# Patient Record
Sex: Female | Born: 1970 | Race: Black or African American | Hispanic: No | Marital: Single | Smoking: Current every day smoker
Health system: Southern US, Community
[De-identification: ages and names within clinical notes are randomized; demographics above are authoritative.]

## PROBLEM LIST (undated history)

## (undated) DIAGNOSIS — G894 Chronic pain syndrome: Secondary | ICD-10-CM

## (undated) DIAGNOSIS — K589 Irritable bowel syndrome without diarrhea: Secondary | ICD-10-CM

## (undated) DIAGNOSIS — M797 Fibromyalgia: Secondary | ICD-10-CM

## (undated) DIAGNOSIS — E119 Type 2 diabetes mellitus without complications: Secondary | ICD-10-CM

## (undated) HISTORY — PX: ABDOMINAL HYSTERECTOMY: SHX81

## (undated) HISTORY — PX: HERNIA REPAIR: SHX51

## (undated) HISTORY — PX: TUBAL LIGATION: SHX77

---

## 2012-09-28 ENCOUNTER — Encounter (HOSPITAL_COMMUNITY): Payer: Self-pay | Admitting: Emergency Medicine

## 2012-09-28 ENCOUNTER — Emergency Department (HOSPITAL_COMMUNITY)
Admission: EM | Admit: 2012-09-28 | Discharge: 2012-09-29 | Disposition: A | Discharge status: Home / Self Care | Payer: Self-pay | Attending: Emergency Medicine | Admitting: Emergency Medicine

## 2012-09-28 DIAGNOSIS — H539 Unspecified visual disturbance: Secondary | ICD-10-CM | POA: Insufficient documentation

## 2012-09-28 DIAGNOSIS — R209 Unspecified disturbances of skin sensation: Secondary | ICD-10-CM | POA: Insufficient documentation

## 2012-09-28 DIAGNOSIS — IMO0001 Reserved for inherently not codable concepts without codable children: Secondary | ICD-10-CM | POA: Insufficient documentation

## 2012-09-28 DIAGNOSIS — G894 Chronic pain syndrome: Secondary | ICD-10-CM | POA: Insufficient documentation

## 2012-09-28 DIAGNOSIS — E119 Type 2 diabetes mellitus without complications: Secondary | ICD-10-CM | POA: Insufficient documentation

## 2012-09-28 DIAGNOSIS — Z794 Long term (current) use of insulin: Secondary | ICD-10-CM | POA: Insufficient documentation

## 2012-09-28 DIAGNOSIS — R51 Headache: Secondary | ICD-10-CM | POA: Insufficient documentation

## 2012-09-28 DIAGNOSIS — M797 Fibromyalgia: Secondary | ICD-10-CM | POA: Insufficient documentation

## 2012-09-28 DIAGNOSIS — Z79899 Other long term (current) drug therapy: Secondary | ICD-10-CM | POA: Insufficient documentation

## 2012-09-28 DIAGNOSIS — F17200 Nicotine dependence, unspecified, uncomplicated: Secondary | ICD-10-CM | POA: Insufficient documentation

## 2012-09-28 HISTORY — DX: Chronic pain syndrome: G89.4

## 2012-09-28 HISTORY — DX: Fibromyalgia: M79.7

## 2012-09-28 HISTORY — DX: Type 2 diabetes mellitus without complications: E11.9

## 2012-09-28 LAB — COMPREHENSIVE METABOLIC PANEL
CO2: 25 mEq/L (ref 19–32)
Chloride: 99 mEq/L (ref 96–112)
Creatinine, Ser: 0.47 mg/dL — ABNORMAL LOW (ref 0.50–1.10)
GFR calc Af Amer: >90 mL/min (ref >90)
Potassium: 3.5 mEq/L (ref 3.5–5.1)
Sodium: 134 mEq/L — ABNORMAL LOW (ref 135–145)
Total Protein: 7.1 g/dL (ref 6.0–8.3)

## 2012-09-28 LAB — CBC WITH DIFFERENTIAL/PLATELET
Basophils Absolute: 0 10*3/uL (ref 0.0–0.1)
Basophils Relative: 0 % (ref 0–1)
HCT: 41.3 % (ref 36.0–46.0)
Lymphocytes Relative: 33 % (ref 12–46)
MCHC: 33.4 g/dL (ref 30.0–36.0)
Neutro Abs: 4.8 10*3/uL (ref 1.7–7.7)
Neutrophils Relative %: 60 % (ref 43–77)
RDW: 12.7 % (ref 11.5–15.5)
WBC: 7.9 10*3/uL (ref 4.0–10.5)

## 2012-09-28 LAB — GLUCOSE, CAPILLARY: Glucose-Capillary: 307 mg/dL — ABNORMAL HIGH (ref 70–99)

## 2012-09-28 MED ORDER — DIPHENHYDRAMINE HCL 50 MG/ML IJ SOLN
25.0000 mg | Freq: Once | INTRAMUSCULAR | Status: AC
  Administered 2012-09-28: 25 mg via INTRAVENOUS
  Filled 2012-09-28: qty 1

## 2012-09-28 MED ORDER — SODIUM CHLORIDE 0.9 % IV BOLUS (SEPSIS)
1000.0000 mL | Freq: Once | INTRAVENOUS | Status: AC
  Administered 2012-09-28: 1000 mL via INTRAVENOUS

## 2012-09-28 MED ORDER — METOCLOPRAMIDE HCL 5 MG/ML IJ SOLN
10.0000 mg | Freq: Once | INTRAMUSCULAR | Status: AC
  Administered 2012-09-28: 10 mg via INTRAVENOUS
  Filled 2012-09-28: qty 2

## 2012-09-28 MED ORDER — ONDANSETRON HCL 4 MG PO TABS: 4.0000 mg | ORAL_TABLET | Freq: Four times a day (QID) | ORAL | Status: DC

## 2012-09-28 MED ORDER — TRAMADOL HCL 50 MG PO TABS: 50.0000 mg | ORAL_TABLET | Freq: Four times a day (QID) | ORAL | Status: DC | PRN

## 2012-09-28 NOTE — ED Notes (Signed)
Patient complaining of persistent headache for the past two to three days.  Patient also complaining of swollen lymph nodes around neck.  Patient states that she feels "spacy"; denies light sensitivity, dizziness, nausea, and vomiting.  Patient is diabetic.

## 2012-09-28 NOTE — ED Provider Notes (Signed)
History     CSN: [id number]  Arrival date & time 09/28/12  2040   First MD Initiated Contact with Patient 09/28/12 2221      Chief Complaint  Patient presents with  . Headache    (Consider location/radiation/quality/duration/timing/severity/associated sxs/prior treatment) HPI Comments: 42 y /o F h/o DM, fibromyalgia, chronic pain p/w headache. Gradual onset x2-3 days. Feels lumps on back of head, left side. No fevers. No neck pain. Feels a little "strange". Unable to describe.  Patient is a 42 y.o. female presenting with headaches. The history is provided by the patient.  Headache  This is a new problem. The current episode started more than 2 days ago. The problem occurs constantly. The problem has been gradually worsening. The headache is associated with nothing. Pain location: global. The pain is moderate. The pain does not radiate. Pertinent negatives include no fever, no malaise/fatigue, no near-syncope, no shortness of breath, no nausea and no vomiting. She has tried NSAIDs for the symptoms. The treatment provided mild relief.    Past Medical History  Diagnosis Date  . Diabetes mellitus without complication   . Fibromyalgia   . Chronic pain syndrome     Past Surgical History  Procedure Date  . Abdominal hysterectomy   . Hernia repair   . Tubal ligation     History reviewed. No pertinent family history.  History  Substance Use Topics  . Smoking status: Current Every Day Smoker -- 0.5 packs/day  . Smokeless tobacco: Not on file  . Alcohol Use: Yes    OB History    Grav Para Term Preterm Abortions TAB SAB Ect Mult Living                  Review of Systems  Constitutional: Negative for fever, chills and malaise/fatigue.  HENT: Negative for congestion and rhinorrhea.   Eyes: Positive for visual disturbance (blurry vision once earlier, resolved with rubbing eyes). Negative for pain.  Respiratory: Negative for cough and shortness of breath.   Cardiovascular:  Negative for chest pain, leg swelling and near-syncope.  Gastrointestinal: Negative for nausea, vomiting, abdominal pain and diarrhea.  Genitourinary: Negative for dysuria, hematuria, flank pain and difficulty urinating.  Musculoskeletal: Negative for back pain.  Skin: Negative for color change and rash.  Neurological: Positive for numbness (chronic, BUE and BLE. from DM) and headaches. Negative for dizziness and weakness.  All other systems reviewed and are negative.    Allergies  Shellfish allergy and Latex  Home Medications   Current Outpatient Rx  Name  Route  Sig  Dispense  Refill  . IBUPROFEN 200 MG PO TABS   Oral   Take 200-800 mg by mouth daily as needed. For pain         . INSULIN ASPART 100 UNIT/ML [place] SOLN   Subcutaneous   Inject 0-16 Units into the skin 3 (three) times daily before meals. Sliding scale:  Blood sugars over 120 mg - for each additional 25 mg add 1 unit of insulin         . ADULT MULTIVITAMIN W/MINERALS CH   Oral   Take 1 tablet by mouth daily.         Marland Kitchen ONDANSETRON HCL 4 MG PO TABS   Oral   Take 1 tablet (4 mg total) by mouth every 6 (six) hours.   12 tablet   0   . TRAMADOL HCL 50 MG PO TABS   Oral   Take 1 tablet (50 mg total) by  mouth every 6 (six) hours as needed for pain.   5 tablet   0     BP 137/90  Pulse 87  Temp 98.6 F (37 C) (Oral)  Resp 18  SpO2 96%  Physical Exam  Nursing note and vitals reviewed. Constitutional: She is oriented to person, place, and time. She appears well-developed and well-nourished. No distress.  HENT:  Head: Normocephalic and atraumatic.       Very small palpable NTTP lymph node to left posterior scalp  Eyes: Conjunctivae normal are normal. Right eye exhibits no discharge. Left eye exhibits no discharge.  Neck: No tracheal deviation present.  Cardiovascular: Normal rate, regular rhythm, normal heart sounds and intact distal pulses.   Pulmonary/Chest: Effort normal and breath sounds normal. No  stridor. No respiratory distress. She has no wheezes. She has no rales.  Abdominal: Soft. She exhibits no distension. There is no tenderness. There is no guarding.  Musculoskeletal: She exhibits no edema and no tenderness.  Neurological: She is alert and oriented to person, place, and time. She has normal strength. No cranial nerve deficit or sensory deficit. Coordination normal. GCS eye subscore is 4. GCS verbal subscore is 5. GCS motor subscore is 6.  Skin: Skin is warm and dry.  Psychiatric: She has a normal mood and affect. Her behavior is normal.    ED Course  Procedures (including critical care time)  Labs Reviewed  COMPREHENSIVE METABOLIC PANEL - Abnormal; Notable for the following:    Sodium 134 (*)     Glucose, Bld 370 (*)     Creatinine, Ser 0.47 (*)     Albumin 3.1 (*)     Total Bilirubin 0.1 (*)     All other components within normal limits  GLUCOSE, CAPILLARY - Abnormal; Notable for the following:    Glucose-Capillary 307 (*)     All other components within normal limits  CBC WITH DIFFERENTIAL   No results found.   1. Headache       MDM    42 y/o F p/w HA. HDS, af. NAD. Neuro intact. Gradual onset. No thunderclap. No injuries. No LOC or syncope. Symptoms improved after HA cocktail.  Doubt idiopathic intracranial hypertension, cerebral venous thrombosis, temporal arteritis, skull fracture, epidural hematoma, subdural hematoma, subarachnoid hemorrhage, or other intracranial hemorrhage, concussion, trigeminal neuralgia, cluster headache, eye pathology or other emergent pathology as this is an atypical history and physical, low risk, and primary diagnosis is much more likely,.     Dc home. Return precautions given. Follow up with primary care physician. Patient in agreement with plan   Labs and imaging reviewed by myself and considered in medical decision making if ordered. Imaging interpreted by radiology.   Discussed case with Dr. Ranae Palms who is in agreement  with assessment and plan.  New Prescriptions   ONDANSETRON (ZOFRAN) 4 MG TABLET    Take 1 tablet (4 mg total) by mouth every 6 (six) hours.   TRAMADOL (ULTRAM) 50 MG TABLET    Take 1 tablet (50 mg total) by mouth every 6 (six) hours as needed for pain.          Stevie Kern, MD 09/29/12 [phone]

## 2012-09-28 NOTE — ED Notes (Signed)
Patient states that her sugar feels low; requesting CBG to be checked.

## 2012-09-29 NOTE — ED Provider Notes (Signed)
I saw and evaluated the patient, reviewed the resident's note and I agree with the findings and plan.   Loren Racer, MD 09/29/12 [phone]

## 2014-02-27 ENCOUNTER — Emergency Department (HOSPITAL_COMMUNITY): Payer: Self-pay

## 2014-02-27 ENCOUNTER — Emergency Department (HOSPITAL_COMMUNITY)
Admission: EM | Admit: 2014-02-27 | Discharge: 2014-02-27 | Disposition: A | Discharge status: Home / Self Care | Payer: Self-pay | Attending: Emergency Medicine | Admitting: Emergency Medicine

## 2014-02-27 ENCOUNTER — Encounter (HOSPITAL_COMMUNITY): Payer: Self-pay | Admitting: Emergency Medicine

## 2014-02-27 DIAGNOSIS — F17200 Nicotine dependence, unspecified, uncomplicated: Secondary | ICD-10-CM | POA: Insufficient documentation

## 2014-02-27 DIAGNOSIS — S0993XA Unspecified injury of face, initial encounter: Secondary | ICD-10-CM | POA: Insufficient documentation

## 2014-02-27 DIAGNOSIS — Z8719 Personal history of other diseases of the digestive system: Secondary | ICD-10-CM | POA: Insufficient documentation

## 2014-02-27 DIAGNOSIS — E119 Type 2 diabetes mellitus without complications: Secondary | ICD-10-CM | POA: Insufficient documentation

## 2014-02-27 DIAGNOSIS — Z91040 Latex allergy status: Secondary | ICD-10-CM | POA: Insufficient documentation

## 2014-02-27 DIAGNOSIS — IMO0001 Reserved for inherently not codable concepts without codable children: Secondary | ICD-10-CM | POA: Insufficient documentation

## 2014-02-27 DIAGNOSIS — Y9389 Activity, other specified: Secondary | ICD-10-CM | POA: Insufficient documentation

## 2014-02-27 DIAGNOSIS — Z79899 Other long term (current) drug therapy: Secondary | ICD-10-CM | POA: Insufficient documentation

## 2014-02-27 DIAGNOSIS — S199XXA Unspecified injury of neck, initial encounter: Secondary | ICD-10-CM

## 2014-02-27 DIAGNOSIS — S298XXA Other specified injuries of thorax, initial encounter: Secondary | ICD-10-CM | POA: Insufficient documentation

## 2014-02-27 DIAGNOSIS — Y929 Unspecified place or not applicable: Secondary | ICD-10-CM | POA: Insufficient documentation

## 2014-02-27 DIAGNOSIS — G8929 Other chronic pain: Secondary | ICD-10-CM | POA: Insufficient documentation

## 2014-02-27 DIAGNOSIS — Z3202 Encounter for pregnancy test, result negative: Secondary | ICD-10-CM | POA: Insufficient documentation

## 2014-02-27 DIAGNOSIS — Y32XXXA Crashing of motor vehicle, undetermined intent, initial encounter: Secondary | ICD-10-CM | POA: Insufficient documentation

## 2014-02-27 DIAGNOSIS — Z794 Long term (current) use of insulin: Secondary | ICD-10-CM | POA: Insufficient documentation

## 2014-02-27 DIAGNOSIS — S3981XA Other specified injuries of abdomen, initial encounter: Secondary | ICD-10-CM | POA: Insufficient documentation

## 2014-02-27 HISTORY — DX: Irritable bowel syndrome, unspecified: K58.9

## 2014-02-27 LAB — I-STAT CHEM 8, ED
BUN: 10 mg/dL (ref 6–23)
CALCIUM ION: 1.22 mmol/L (ref 1.12–1.23)
CHLORIDE: 106 meq/L (ref 96–112)
CREATININE: 0.5 mg/dL (ref 0.50–1.10)
Glucose, Bld: 145 mg/dL — ABNORMAL HIGH (ref 70–99)
HCT: 44 % (ref 36.0–46.0)
Hemoglobin: 15 g/dL (ref 12.0–15.0)
Potassium: 3.5 mEq/L — ABNORMAL LOW (ref 3.7–5.3)
Sodium: 145 mEq/L (ref 137–147)
TCO2: 23 mmol/L (ref 0–100)

## 2014-02-27 LAB — CBG MONITORING, ED: GLUCOSE-CAPILLARY: 260 mg/dL — AB (ref 70–99)

## 2014-02-27 LAB — POC URINE PREG, ED: PREG TEST UR: NEGATIVE

## 2014-02-27 MED ORDER — OXYCODONE-ACETAMINOPHEN 5-325 MG PO TABS
2.0000 | ORAL_TABLET | Freq: Once | ORAL | Status: AC
  Administered 2014-02-27: 2 via ORAL
  Filled 2014-02-27: qty 2

## 2014-02-27 MED ORDER — IOHEXOL 300 MG/ML  SOLN
100.0000 mL | Freq: Once | INTRAMUSCULAR | Status: AC | PRN
  Administered 2014-02-27: 100 mL via INTRAVENOUS

## 2014-02-27 MED ORDER — OXYCODONE-ACETAMINOPHEN 5-325 MG PO TABS
1.0000 | ORAL_TABLET | Freq: Once | ORAL | Status: AC
Start: 2014-02-27 — End: ?

## 2014-02-27 MED ORDER — CYCLOBENZAPRINE HCL 5 MG PO TABS: 5.0000 mg | ORAL_TABLET | Freq: Two times a day (BID) | ORAL | Status: AC | PRN

## 2014-02-27 MED ORDER — MORPHINE SULFATE 4 MG/ML IJ SOLN
6.0000 mg | Freq: Once | INTRAMUSCULAR | Status: DC
Start: 2014-02-27 — End: 2014-02-27

## 2014-02-27 MED ORDER — ONDANSETRON HCL 4 MG/2ML IJ SOLN: 4.0000 mg | Freq: Once | INTRAMUSCULAR | Status: DC

## 2014-02-27 MED ORDER — CYCLOBENZAPRINE HCL 10 MG PO TABS: 5.0000 mg | ORAL_TABLET | Freq: Two times a day (BID) | ORAL | Status: DC | PRN

## 2014-02-27 NOTE — ED Notes (Signed)
EKG signed by Dr. Bebe ShaggyWickline at 16:08.

## 2014-02-27 NOTE — ED Notes (Signed)
Patient transported to CT 

## 2014-02-27 NOTE — ED Notes (Signed)
Dr. Rubin PayorPickering at the bedside. Preparing for discharge.
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
## 2014-02-27 NOTE — ED Notes (Signed)
CBG 260

## 2014-02-27 NOTE — ED Notes (Signed)
Returned from radiology and reattached to monitor.
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
## 2018-09-27 ENCOUNTER — Ambulatory Visit (INDEPENDENT_AMBULATORY_CARE_PROVIDER_SITE_OTHER): Payer: Self-pay | Admitting: Orthopaedic Surgery

## 2024-06-14 ENCOUNTER — Ambulatory Visit (INDEPENDENT_AMBULATORY_CARE_PROVIDER_SITE_OTHER): Admitting: Neurosurgery

## 2024-06-14 ENCOUNTER — Encounter: Payer: Self-pay | Admitting: Neurosurgery

## 2024-06-14 VITALS — BP 138/78 | HR 78 | Ht 66.0 in | Wt 205.0 lb

## 2024-06-14 DIAGNOSIS — Z982 Presence of cerebrospinal fluid drainage device: Secondary | ICD-10-CM | POA: Diagnosis not present

## 2024-06-14 DIAGNOSIS — G932 Benign intracranial hypertension: Secondary | ICD-10-CM

## 2024-06-14 NOTE — Progress Notes (Signed)
 53 year old lady with a history of idiopathic intracranial hypertension and placement of a right-sided ventriculoperitoneal shunt.  This was many years ago and she did very well.  Unfortunately she developed elevated intracranial pressures again and this was confirmed with a lumbar puncture.  She subsequently had a right sided transverse/sigmoid stent placed and was discharged home the next day.  About a week thereafter, she started having intermittent vertigo: She has the spells in which the world spins around her and she is very nauseous with that.  She does not complain of any ringing in her ears.  She has fallen a couple of times and also feels that her equilibrium is completely off.  She does not endorse any loss of consciousness.  She was evaluated by cardiology and they do not feel that there is anything wrong with her.  I told her that her valve is set at 120 and now she also has a standing very well may be that she is over draining.  I will get x-rays and I specifically requested a flatplate to evaluate the valve setting and I will also get a CT scan of the head.  I will request the old images as well.  I will also request ENT for an evaluation and I have sent the information to the [facility] team as she wants that care provider locally.

## 2024-06-21 ENCOUNTER — Telehealth: Payer: Self-pay

## 2024-06-21 ENCOUNTER — Ambulatory Visit
Admission: RE | Admit: 2024-06-21 | Discharge: 2024-06-21 | Disposition: A | Source: Ambulatory Visit | Attending: Neurosurgery | Admitting: Neurosurgery

## 2024-06-21 DIAGNOSIS — G932 Benign intracranial hypertension: Secondary | ICD-10-CM

## 2024-06-21 MED ORDER — PREDNISONE 50 MG PO TABS: ORAL_TABLET | ORAL | 0 refills | Status: DC

## 2024-06-25 ENCOUNTER — Ambulatory Visit
Admission: RE | Admit: 2024-06-25 | Discharge: 2024-06-25 | Disposition: A | Discharge status: Home / Self Care | Source: Ambulatory Visit | Attending: Neurosurgery | Admitting: Neurosurgery

## 2024-06-25 DIAGNOSIS — G932 Benign intracranial hypertension: Secondary | ICD-10-CM

## 2024-06-25 MED ORDER — IOPAMIDOL (ISOVUE-370) INJECTION 76%
75.0000 mL | Freq: Once | INTRAVENOUS | Status: AC | PRN
  Administered 2024-06-25: 75 mL via INTRAVENOUS

## 2024-06-27 ENCOUNTER — Inpatient Hospital Stay
Admission: RE | Admit: 2024-06-27 | Discharge: 2024-06-27 | Disposition: A | Discharge status: Home / Self Care | Payer: Self-pay | Source: Ambulatory Visit | Attending: Neurosurgery | Admitting: Neurosurgery

## 2024-06-27 ENCOUNTER — Other Ambulatory Visit: Payer: Self-pay

## 2024-06-27 DIAGNOSIS — Z049 Encounter for examination and observation for unspecified reason: Secondary | ICD-10-CM

## 2024-06-28 ENCOUNTER — Ambulatory Visit
Admission: RE | Admit: 2024-06-28 | Discharge: 2024-06-28 | Disposition: A | Discharge status: Home / Self Care | Payer: Self-pay | Source: Ambulatory Visit | Attending: Neurosurgery | Admitting: Neurosurgery

## 2024-06-28 ENCOUNTER — Encounter: Payer: Self-pay | Admitting: Neurosurgery

## 2024-06-28 ENCOUNTER — Ambulatory Visit (INDEPENDENT_AMBULATORY_CARE_PROVIDER_SITE_OTHER): Admitting: Neurosurgery

## 2024-06-28 ENCOUNTER — Ambulatory Visit
Admission: RE | Admit: 2024-06-28 | Discharge: 2024-06-28 | Disposition: A | Discharge status: Home / Self Care | Source: Ambulatory Visit | Attending: Neurosurgery | Admitting: Neurosurgery

## 2024-06-28 VITALS — BP 143/89 | HR 86 | Temp 97.8°F | Ht 66.0 in | Wt 205.0 lb

## 2024-06-28 DIAGNOSIS — G932 Benign intracranial hypertension: Secondary | ICD-10-CM

## 2024-06-28 DIAGNOSIS — Z049 Encounter for examination and observation for unspecified reason: Secondary | ICD-10-CM

## 2024-06-28 DIAGNOSIS — Z982 Presence of cerebrospinal fluid drainage device: Secondary | ICD-10-CM | POA: Diagnosis not present

## 2024-06-28 NOTE — Addendum Note (Signed)
 Addended by: Linlee Cromie on: 06/28/2024 02:19 PM   Modules accepted: Orders

## 2024-06-28 NOTE — Progress Notes (Signed)
 53 year old lady with idiopathic intracranial hypertension and right-sided ventriculoperitoneal shunt.  She has recurrence of her symptoms.  Recently [a couple months ago], we placed a right sided transverse/sigmoid stent.  Intraoperatively there was a stenosis that could not be remedied due to active stricture of the transverse sinus which was not remedied by balloon inflation.  Postoperatively she did well from a pressure standpoint but she had severe delirium in the ICU.  She returned with recurrent symptoms and was referred back to me.  She was complaining of dizziness as well and I contacted Dr. Huel, otolaryngologist at [facility] who was going to see her.  She says that she is has still not seen them.  She says that at the retroauricular area still swells a lot and she has to take her glasses off.  On today's examination I do not see any significant swelling.  I told her that I would like for her to take pictures of this.  And I will still want her to call the [facility] team in [facility] to get an appointment.  I reviewed the x-rays and it shows that her valve is set somewhere between 70 and 80. I reviewed the CT venogram and this still shows that there is a stenosis in the mid segment of the stent.  I do not think that going in and doing a balloon angioplasty is safe as this can result in a tear of the transverse sinus.  I looked at the x-rays further and it seems that the peritoneal tubing is all the way to the right but it is not very deep in the peritoneal cavity.  To that end, I will get a CT of the chest and abdomen/pelvis to see if it is even in the abdominal cavity.  Lastly, I will order a lumbar puncture to check her pressures.  She has had multiple of these and the pressures have always ranged between 22 and 30.  It is theoretically possible that she is over draining with that valve setting.  I will see her back in a few weeks.

## 2024-07-01 NOTE — Addendum Note (Signed)
 Addended by: Giovanni Bath on: 07/01/2024 09:49 AM   Modules accepted: Orders

## 2024-07-10 ENCOUNTER — Ambulatory Visit
Admission: RE | Admit: 2024-07-10 | Discharge: 2024-07-10 | Disposition: A | Discharge status: Home / Self Care | Source: Ambulatory Visit | Attending: Neurosurgery | Admitting: Neurosurgery

## 2024-07-10 DIAGNOSIS — G932 Benign intracranial hypertension: Secondary | ICD-10-CM

## 2024-07-10 DIAGNOSIS — Z982 Presence of cerebrospinal fluid drainage device: Secondary | ICD-10-CM

## 2024-07-15 ENCOUNTER — Inpatient Hospital Stay: Admission: RE | Admit: 2024-07-15 | Source: Ambulatory Visit

## 2024-07-23 ENCOUNTER — Other Ambulatory Visit

## 2024-07-26 ENCOUNTER — Ambulatory Visit: Admitting: Neurosurgery

## 2024-08-08 NOTE — Discharge Instructions (Signed)

## 2024-08-09 ENCOUNTER — Inpatient Hospital Stay
Admission: RE | Admit: 2024-08-09 | Discharge: 2024-08-09 | Disposition: A | Source: Ambulatory Visit | Attending: Neurosurgery | Admitting: Neurosurgery

## 2024-08-09 DIAGNOSIS — G932 Benign intracranial hypertension: Secondary | ICD-10-CM

## 2024-08-12 ENCOUNTER — Telehealth: Payer: Self-pay

## 2024-08-12 DIAGNOSIS — G9600 Cerebrospinal fluid leak, unspecified: Secondary | ICD-10-CM

## 2024-08-12 NOTE — Telephone Encounter (Signed)
 Patient had a lumbar puncture on Friday 08/09/2024.   [facility] staff called that patient turned up at the facility with complaints of headache over the weekend despite being on bedrest all weekend.   Patient went to [facility] for evaluation and to see if a blood patch was needed. [facility] staff called to see if a blood patch could be ordered.   I told them that I would need to discuss with Dr. Janjua and cannot place order without verbal.   He is in surgery until approximately 1:45pm this afternoon.

## 2024-08-13 ENCOUNTER — Telehealth: Payer: Self-pay

## 2024-08-13 ENCOUNTER — Inpatient Hospital Stay
Admission: RE | Admit: 2024-08-13 | Discharge: 2024-08-13 | Disposition: A | Source: Ambulatory Visit | Attending: Neurosurgery | Admitting: Neurosurgery

## 2024-08-13 MED ORDER — PREDNISONE 50 MG PO TABS: ORAL_TABLET | ORAL | 0 refills | Status: AC

## 2024-08-13 NOTE — Progress Notes (Signed)
 Phone call to patient to review instructions for 13 hr prep for CT w/ contrast on 08/13/24 at 1030 AM. Prescription called into [facility]. Pt aware and verbalized understanding of instructions. Prescription: 12/09 2130- 50mg  Prednisone  12/10 0330- 50mg  Prednisone  0930 - 50mg  Prednisone  and 50mg  Benadryl 

## 2024-08-13 NOTE — Progress Notes (Signed)
 Spoke with pt earlier today to clarify anticoagulants. Pt stated she has been off of Plavix and Aspirin since Friday 12/05 because of the post LP headache and nausea.

## 2024-08-13 NOTE — Telephone Encounter (Signed)
 I spoke with [facility], they can so her blood patch today.   I called the patient and passed on that she should head there and be there before 1:30pm at the latest per Sarah.   She has not eaten anything this morning and will not eat anything.   She is aware that she can drink fluids.   She is coming to see us  on Friday for a follow up.

## 2024-08-13 NOTE — Telephone Encounter (Signed)
 Patient scheduled for Blood Patch today.

## 2024-08-13 NOTE — Telephone Encounter (Signed)
 Patient called that she had arrived at [facility] and they informed her that she could not have her Blood Patch today because she has not been premedicated for her contrast allergy.   I spoke with Dr.Janjua who recommended that I ask Dr. Lester for the most up to date protocol on premedication for contrast allergy.   Per Dr. Lester, Prednisone  50 mg at 13 hours, 7 hours, and 1 hour before then Benadryl  50 mg 1 hour before.   I discussed with Wilbern Dills, Scheduler for [facility]. Patient was rescheduled for tomorrow but then found out that she cannot have the blood patch for 5 days due to needing to be off of her plavix per Radiology MD.  Per Wilbern, [facility] nurse will have medications sent in for pre-treatment and take over coordination of care with the patient.

## 2024-08-13 NOTE — Discharge Instructions (Signed)
 Blood Patch Discharge Instructions ? ?Go home and rest quietly for the next 24 hours.  It is important to lie flat for the next 24 hours.  Get up only to go to the restroom.  You may lie in the bed or on a couch on your back, your stomach, your left side or your right side.  You may have one pillow under your head.  You may have pillows between your knees while you are on your side or under your knees while you are on your back. ? ?DO NOT drive today.  Recline the seat as far back as it will go, while still wearing your seat belt, on the way home. ? ?You may get up to go to the bathroom as needed.  You may sit up for 10 minutes to eat.  You may resume your normal diet and medications unless otherwise indicated.  Drink lots of extra fluids today and tomorrow..  ? ?You may resume normal activities after your 24 hours of bed rest is over; however, do not exert yourself strongly or do any heavy lifting tomorrow. ? ?Call your physician for a follow-up appointment.  ? ?If you have any questions  after you arrive home, please call [phone]. ? ?Discharge instructions have been explained to the patient.  The patient, or the person responsible for the patient, fully understands these instructions. ? ?   ?

## 2024-08-14 ENCOUNTER — Inpatient Hospital Stay
Admission: RE | Admit: 2024-08-14 | Discharge: 2024-08-14 | Disposition: A | Source: Ambulatory Visit | Attending: Neurosurgery | Admitting: Neurosurgery

## 2024-08-14 DIAGNOSIS — G9600 Cerebrospinal fluid leak, unspecified: Secondary | ICD-10-CM

## 2024-08-14 MED ORDER — IOPAMIDOL (ISOVUE-M 200) INJECTION 41%
1.0000 mL | Freq: Once | INTRAMUSCULAR | Status: AC
  Administered 2024-08-14: 1 mL via EPIDURAL

## 2024-08-14 NOTE — Discharge Instructions (Signed)
 Blood Patch Discharge Instructions ? ?Go home and rest quietly for the next 24 hours.  It is important to lie flat for the next 24 hours.  Get up only to go to the restroom.  You may lie in the bed or on a couch on your back, your stomach, your left side or your right side.  You may have one pillow under your head.  You may have pillows between your knees while you are on your side or under your knees while you are on your back. ? ?DO NOT drive today.  Recline the seat as far back as it will go, while still wearing your seat belt, on the way home. ? ?You may get up to go to the bathroom as needed.  You may sit up for 10 minutes to eat.  You may resume your normal diet and medications unless otherwise indicated.  Drink lots of extra fluids today and tomorrow..  ? ?You may resume normal activities after your 24 hours of bed rest is over; however, do not exert yourself strongly or do any heavy lifting tomorrow. ? ?Call your physician for a follow-up appointment.  ? ?If you have any questions  after you arrive home, please call [phone]. ? ?Discharge instructions have been explained to the patient.  The patient, or the person responsible for the patient, fully understands these instructions. ? ?   ?

## 2024-08-16 ENCOUNTER — Encounter: Payer: Self-pay | Admitting: Neurosurgery

## 2024-08-16 ENCOUNTER — Ambulatory Visit: Admitting: Neurosurgery

## 2024-08-16 VITALS — BP 145/81 | HR 78 | Temp 98.7°F | Ht 66.0 in | Wt 199.8 lb

## 2024-08-16 DIAGNOSIS — Z982 Presence of cerebrospinal fluid drainage device: Secondary | ICD-10-CM | POA: Diagnosis not present

## 2024-08-16 DIAGNOSIS — G932 Benign intracranial hypertension: Secondary | ICD-10-CM | POA: Diagnosis not present

## 2024-08-16 NOTE — Progress Notes (Signed)
 23 lady with idiopathic intracranial hypertension with a shunt as well as a right sided transverse sigmoid junction stent.  Unfortunately there is a tight stricture and we were unable to balloon open this.  She was continued to have headaches and we requested a lumbar puncture.  Much to my dismay, the lumbar puncture was done in the prone position and this showed an opening pressure of 32 cm.  She needed a blood patch.  CT of the abdomen pelvis demonstrates that the tubing is intra-abdominal/intraperitoneal.  She is having some discomfort.  We had a lengthy conversation.  The lumbar puncture opening pressures are obviously unreliable.  At this moment I do not think that we should do anything because I want to see what her ENT surgeon thinks of her hearing.  This has been bothering her the most and since lumbar puncture she says that everything sounds like a robot.  Once she has seen them, she will come back and talk to me and then we will have to talk about whether or not to do a shunt revision.  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  This will have to be considered further and what the best next step is.

## 2024-09-10 ENCOUNTER — Ambulatory Visit: Admitting: Neurosurgery
